# Patient Record
Sex: Female | Born: 1970 | Race: White | Hispanic: No | Marital: Single | Smoking: Never smoker
Health system: Southern US, Community
[De-identification: ages and names within clinical notes are randomized; demographics above are authoritative.]

## PROBLEM LIST (undated history)

## (undated) DIAGNOSIS — G7102 Facioscapulohumeral muscular dystrophy: Secondary | ICD-10-CM

## (undated) HISTORY — PX: ABDOMINOPLASTY: SUR9

## (undated) HISTORY — PX: ANKLE SURGERY: SHX546

## (undated) HISTORY — PX: LAPAROSCOPIC GASTRIC SLEEVE RESECTION: SHX5895

---

## 2019-01-25 ENCOUNTER — Encounter: Payer: Self-pay | Admitting: Emergency Medicine

## 2019-01-25 ENCOUNTER — Emergency Department
Admission: EM | Admit: 2019-01-25 | Discharge: 2019-01-25 | Disposition: A | Discharge status: Home / Self Care | Payer: Medicare Other | Attending: Emergency Medicine | Admitting: Emergency Medicine

## 2019-01-25 ENCOUNTER — Emergency Department: Payer: Medicare Other

## 2019-01-25 ENCOUNTER — Other Ambulatory Visit: Payer: Self-pay

## 2019-01-25 DIAGNOSIS — Y92009 Unspecified place in unspecified non-institutional (private) residence as the place of occurrence of the external cause: Secondary | ICD-10-CM | POA: Insufficient documentation

## 2019-01-25 DIAGNOSIS — G44311 Acute post-traumatic headache, intractable: Secondary | ICD-10-CM | POA: Insufficient documentation

## 2019-01-25 DIAGNOSIS — Y998 Other external cause status: Secondary | ICD-10-CM | POA: Insufficient documentation

## 2019-01-25 DIAGNOSIS — S161XXA Strain of muscle, fascia and tendon at neck level, initial encounter: Secondary | ICD-10-CM

## 2019-01-25 DIAGNOSIS — S0181XA Laceration without foreign body of other part of head, initial encounter: Secondary | ICD-10-CM | POA: Insufficient documentation

## 2019-01-25 DIAGNOSIS — Y9389 Activity, other specified: Secondary | ICD-10-CM | POA: Diagnosis not present

## 2019-01-25 DIAGNOSIS — G7100 Muscular dystrophy, unspecified: Secondary | ICD-10-CM | POA: Insufficient documentation

## 2019-01-25 DIAGNOSIS — S0990XA Unspecified injury of head, initial encounter: Secondary | ICD-10-CM | POA: Diagnosis not present

## 2019-01-25 DIAGNOSIS — W07XXXA Fall from chair, initial encounter: Secondary | ICD-10-CM | POA: Insufficient documentation

## 2019-01-25 HISTORY — DX: Facioscapulohumeral muscular dystrophy: G71.02

## 2019-01-25 MED ORDER — HYDROCODONE-ACETAMINOPHEN 5-325 MG PO TABS
1.0000 | ORAL_TABLET | Freq: Once | ORAL | Status: AC
  Administered 2019-01-25: 1 via ORAL
  Filled 2019-01-25: qty 1

## 2019-01-25 MED ORDER — HYDROCODONE-ACETAMINOPHEN 5-325 MG PO TABS: 1.0000 | ORAL_TABLET | Freq: Four times a day (QID) | ORAL | 0 refills | Status: AC | PRN

## 2019-01-25 MED ORDER — BUTALBITAL-APAP-CAFFEINE 50-325-40 MG PO TABS
2.0000 | ORAL_TABLET | Freq: Once | ORAL | Status: AC
  Administered 2019-01-25: 2 via ORAL
  Filled 2019-01-25: qty 2

## 2019-01-25 MED ORDER — LIDOCAINE-EPINEPHRINE-TETRACAINE (LET) SOLUTION
3.0000 mL | Freq: Once | NASAL | Status: AC
  Administered 2019-01-25: 3 mL via TOPICAL
  Filled 2019-01-25: qty 3

## 2019-01-25 MED ORDER — LIDOCAINE HCL (PF) 1 % IJ SOLN
5.0000 mL | Freq: Once | INTRAMUSCULAR | Status: AC
  Administered 2019-01-25: 15:00:00 5 mL via INTRADERMAL
  Filled 2019-01-25: qty 5

## 2019-01-25 NOTE — ED Notes (Signed)
AAOx3.  Skin warm and dry.  NAD 

## 2019-01-25 NOTE — Discharge Instructions (Signed)
Go to urgent care for suture removal in 5 days.  Return to the ER for any symptoms of concern.

## 2019-01-25 NOTE — ED Triage Notes (Signed)
Arrives via [facility].  Patient was getting on stair lift, patient slipped on a plastic bag and fell forward onto concrete floor, laceration to chin.  No LOC

## 2019-01-25 NOTE — ED Provider Notes (Signed)
[facility] Emergency Department Provider Note ____________________________________________  Time seen: Approximately 2:02 PM  I have reviewed the triage vital signs and the nursing notes.   HISTORY  Chief Complaint Fall    HPI Tracy Waters is a 48 y.o. female who presents to the emergency department for evaluation and treatment after mechanical, non-syncopal fall.  She was attempting to get on her chair lift when her foot slipped on a bag that was lying in the floor and her chin struck concrete.  Patient has a history of muscular dystrophy and states that her arms are too weak to catch herself.  She states that she falls often. Past Medical History:  Diagnosis Date  . 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   Past Surgical History:  Procedure Laterality Date  . ABDOMINOPLASTY    . ANKLE SURGERY    . CESAREAN SECTION    . LAPAROSCOPIC GASTRIC SLEEVE RESECTION      Prior to Admission medications   Medication Sig Start Date End Date Taking? Authorizing Provider  HYDROcodone-acetaminophen (NORCO/VICODIN) 5-325 MG tablet Take 1 tablet by mouth every 6 (six) hours as needed for up to 3 days for severe pain. 01/25/19 01/28/19  Sherrie George B, FNP    Allergies Penicillins  No family history on file.  Social History Social History   Tobacco Use  . Smoking status: Never Smoker  . Smokeless tobacco: Never Used  Substance Use Topics  . Alcohol use: Yes    Comment: social  . Drug use: Never    Review of Systems Constitutional: Negative for fever. Cardiovascular: Negative for chest pain. Respiratory: Negative for shortness of breath. Musculoskeletal: Positive for neck and upper back pain Skin: Positive for laceration to the chin Neurological: Negative for decrease in sensation  ____________________________________________   PHYSICAL EXAM:  VITAL SIGNS: ED Triage Vitals   Enc Vitals Group     BP 01/25/19 1341 122/85     Pulse Rate 01/25/19 1341 79     Resp 01/25/19 1341 16     Temp 01/25/19 1341 97.9 F (36.6 C)     Temp Source 01/25/19 1341 Oral     SpO2 01/25/19 1341 97 %     Weight 01/25/19 1337 155 lb (70.3 kg)     Height 01/25/19 1337 5\' 7"  (1.702 m)     Head Circumference --      Peak Flow --      Pain Score 01/25/19 1337 9     Pain Loc --      Pain Edu? --      Excl. in [place]? --     Constitutional: Alert and oriented. Well appearing and in no acute distress. Eyes: Conjunctivae are clear without discharge or drainage Head: Atraumatic Neck: Midline tenderness to the lower C-spine and upper thoracic spine. Respiratory: No cough. Respirations are even and unlabored. Musculoskeletal: Awake, alert, oriented. Neurologic: At baseline  Skin: 2.5cm laceration under the apex of the chin.  Psychiatric: Affect and behavior are appropriate.  ____________________________________________   LABS (all labs ordered are listed, but only abnormal results are displayed)  Labs Reviewed - No data to display ____________________________________________  RADIOLOGY  CT of the head, cervical spine, and thoracic spine are all negative for acute findings per radiology. ____________________________________________   PROCEDURES  .Marland KitchenLaceration Repair Date/Time: 01/25/2019 9:08 PM Performed by: Victorino Dike, FNP Authorized by: Victorino Dike, FNP   Consent:  Consent obtained:  Verbal   Consent given by:  Patient   Risks discussed:  Infection, pain, retained foreign body and poor cosmetic result Anesthesia (see MAR for exact dosages):    Anesthesia method:  Local infiltration and topical application   Topical anesthetic:  LET   Local anesthetic:  Lidocaine 1% w/o epi Laceration details:    Location:  Face   Face location:  Chin   Length (cm):  2.5 Repair type:    Repair type:  Complex Pre-procedure details:    Preparation:  Patient was prepped  and draped in usual sterile fashion Exploration:    Hemostasis achieved with:  Direct pressure   Wound exploration: entire depth of wound probed and visualized     Contaminated: no   Treatment:    Area cleansed with:  Saline   Amount of cleaning:  Extensive   Irrigation solution:  Sterile saline   Visualized foreign bodies/material removed: no   Subcutaneous repair:    Suture size:  6-0   Suture material:  Vicryl   Number of sutures:  1 Skin repair:    Repair method:  Sutures   Suture size:  6-0   Suture material:  Prolene   Number of sutures:  5 Approximation:    Approximation:  Close Post-procedure details:    Dressing:  Sterile dressing   Patient tolerance of procedure:  Tolerated well, no immediate complications    ____________________________________________   INITIAL IMPRESSION / ASSESSMENT AND PLAN / ED COURSE  Sherilyn CooterCatherine Coble is a 48 y.o. who presents to the emergency department for evaluation after mechanical, non-syncopal fall at home.  She is complaining of headache with some midline cervical and upper thoracic spine tenderness.  CTs will be completed.  She also has a laceration under the apex of her chin that will require sutures.  CTs of the head, cervical spine, and thoracic spine are all reassuring.  Sutures were inserted to the wound under her chin as above.  Patient will be discharged home with prescriptions as listed below.  [facility] sutures removed.  She was encouraged to return to the emergency department for symptoms of concern if unable to schedule an appointment with primary care.  Medications  butalbital-acetaminophen-caffeine (FIORICET) 50-325-40 MG per tablet 2 tablet (2 tablets Oral Given 01/25/19 1417)  lidocaine-EPINEPHrine-tetracaine (LET) solution (3 mLs Topical Given 01/25/19 1515)  lidocaine (PF) (XYLOCAINE) 1 % injection 5 mL (5 mLs Intradermal Given 01/25/19 1516)   HYDROcodone-acetaminophen (NORCO/VICODIN) 5-325 MG per tablet 1 tablet (1 tablet Oral Given 01/25/19 1613)    Pertinent labs & imaging results that were available during my care of the patient were reviewed by me and considered in my medical decision making (see chart for details).  _________________________________________   FINAL CLINICAL IMPRESSION(S) / ED DIAGNOSES  Final diagnoses:  Minor head injury, initial encounter  Cervical strain, acute, initial encounter  Intractable acute post-traumatic headache    ED Discharge Orders         Ordered    HYDROcodone-acetaminophen (NORCO/VICODIN) 5-325 MG tablet  Every 6 hours PRN     01/25/19 1606           If controlled substance prescribed during this visit, 12 month history viewed on the [facility] prior to issuing an initial prescription for Schedule II or III opiod.   Chinita Pesterriplett, Angelica Frandsen B, FNP 01/25/19 2110    Jeanmarie PlantMcShane, James A, MD 01/29/19 1801

## 2020-11-17 IMAGING — CT CT HEAD WITHOUT CONTRAST
5 of 7 series · 17 of 47 positions shown, 18 images · non-contrast
Comparison: None.

CLINICAL DATA: Fall. Pain. No reported loss of consciousness.

EXAM:
CT HEAD WITHOUT CONTRAST
CT CERVICAL SPINE WITHOUT CONTRAST
TECHNIQUE: Multidetector CT imaging of the head and cervical spine was
performed following the standard protocol without intravenous
contrast. Multiplanar CT image reconstructions of the cervical spine
were also generated.

[Series 4: head wo · axial · 0.40mm/px · z∈[+1401,+1446]mm · 2 of 29 slices shown, 3 images]
[im 10/29  brain]
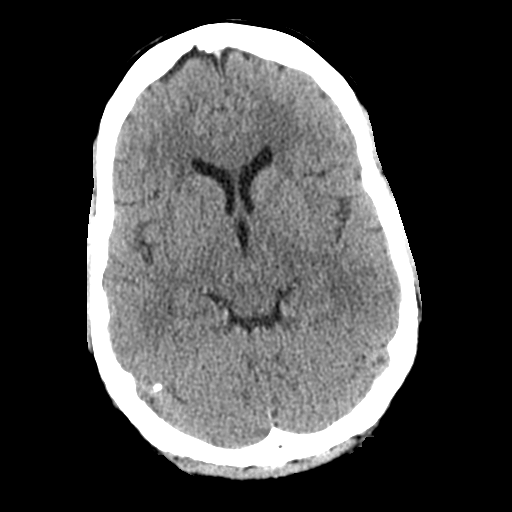
[im 10/29  bone]
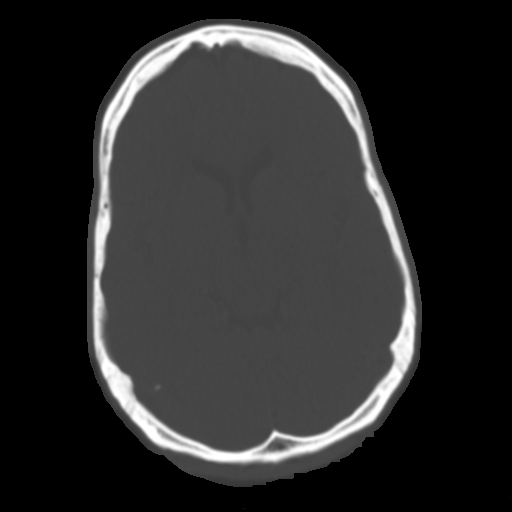
[im 19/29  brain]
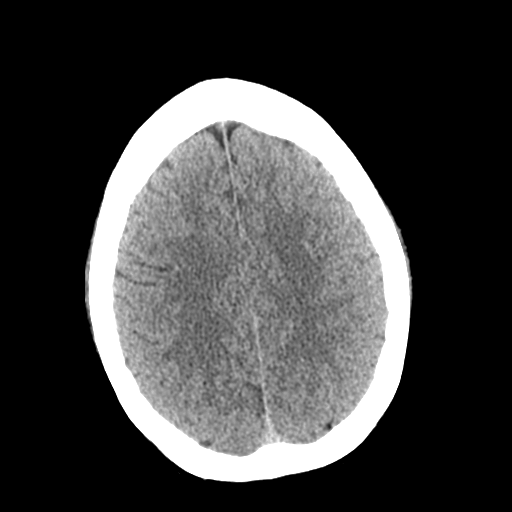

[Series 5: coronal soft tissue · coronal · 0.28mm/px · 3 of 65 slices shown]
[im 19/65  brain]
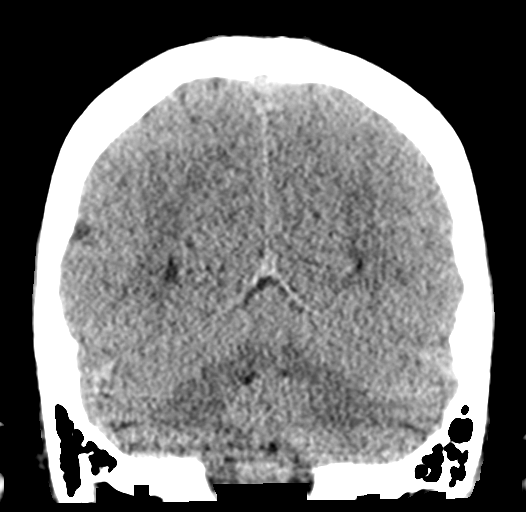
[im 28/65  brain]
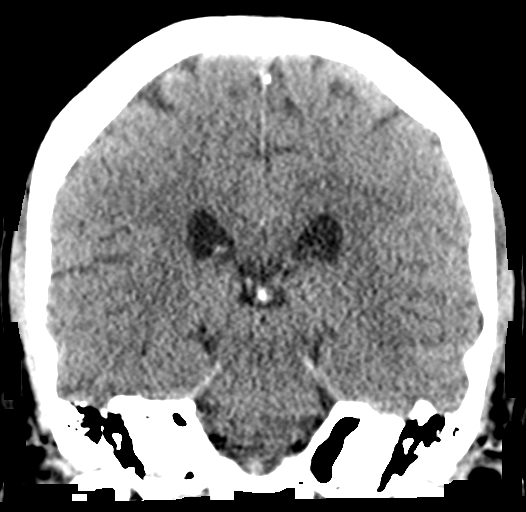
[im 37/65  brain]
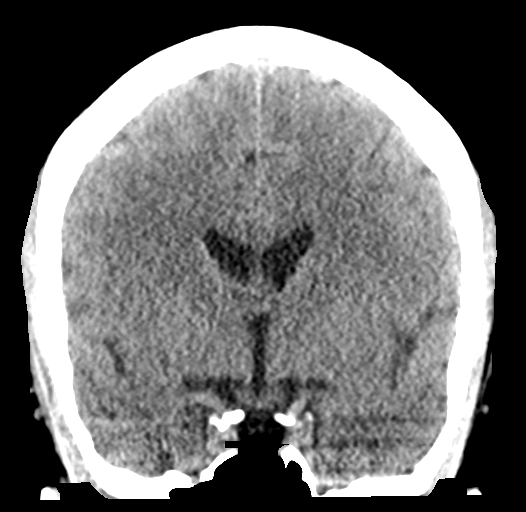

[Series 6: sagittal soft tissue · sagittal · 0.28mm/px · 1 of 50 slices shown]
[im 25/50  brain]
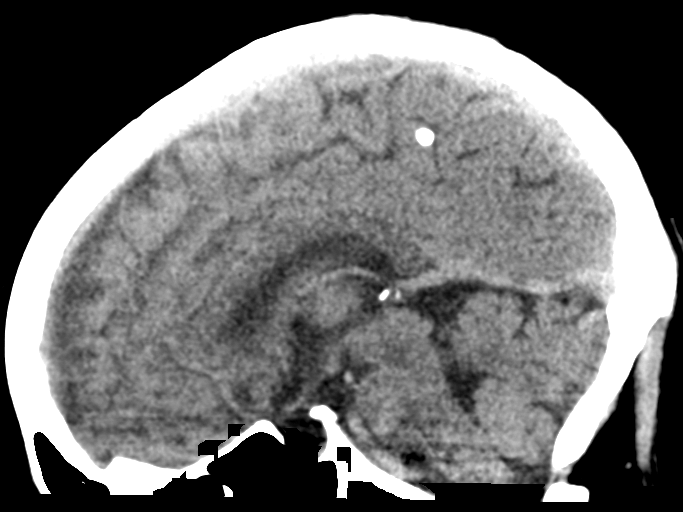

[Series 7: c spine soft · axial · 0.30mm/px · z∈[+1202,+1234]mm · 3 of 84 slices shown]
[im 9/84  brain]
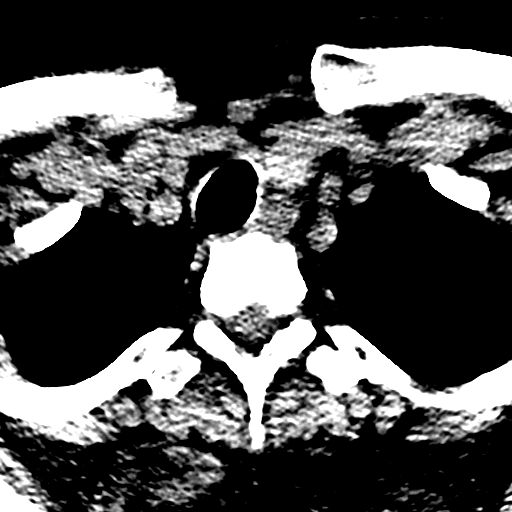
[im 17/84  brain]
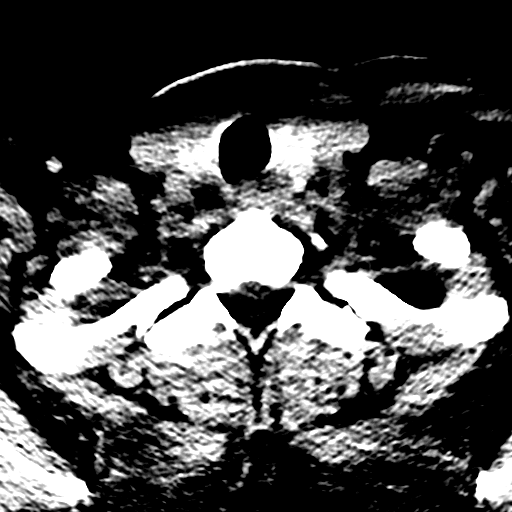
[im 25/84  brain]
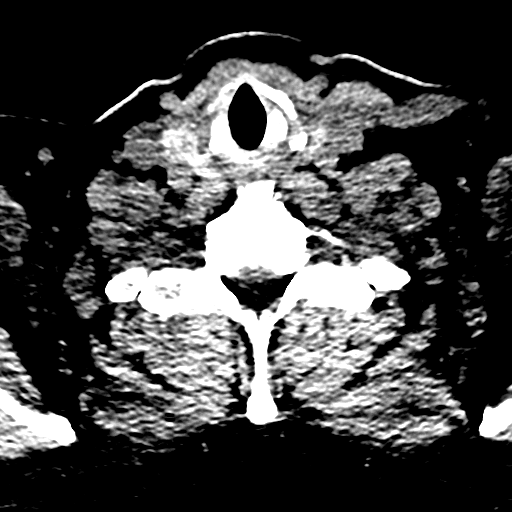

[Series 10: orthogonal bone · axial · 0.21mm/px · z∈[+1159,+1331]mm · 8 of 113 slices shown]
[im 9/113  bone]
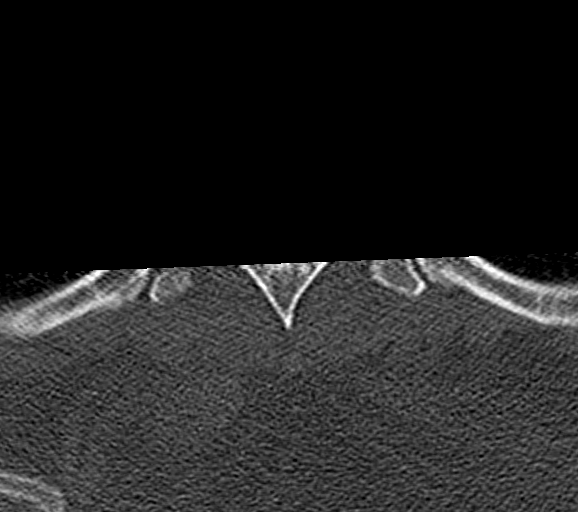
[im 25/113  bone]
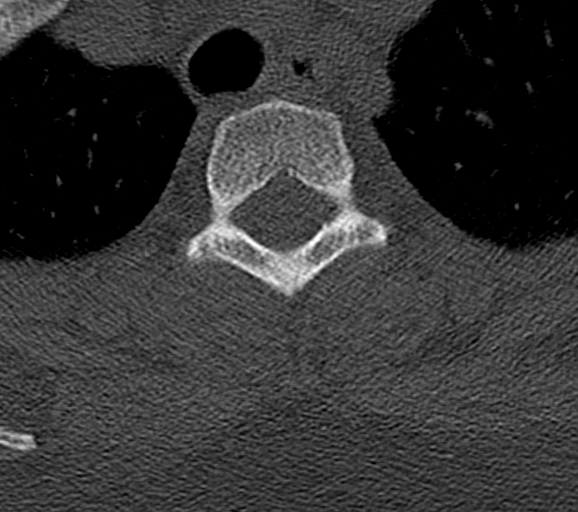
[im 41/113  bone]
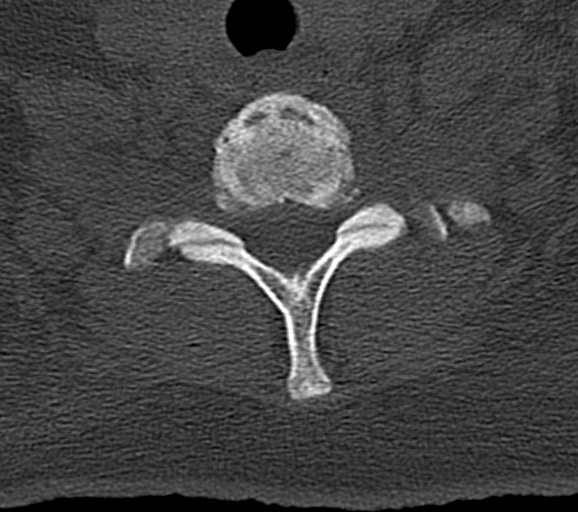
[im 49/113  bone]
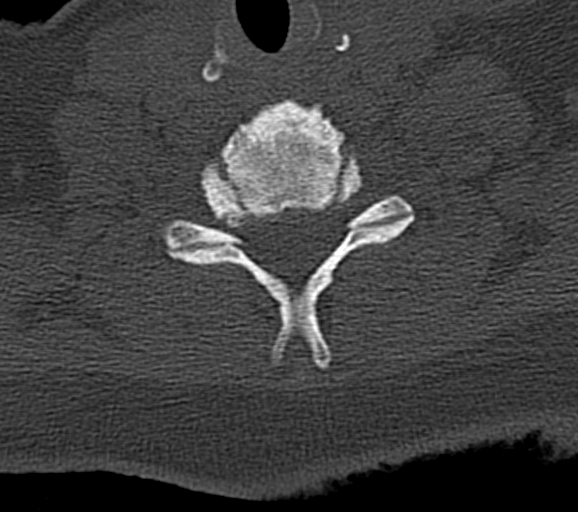
[im 65/113  bone]
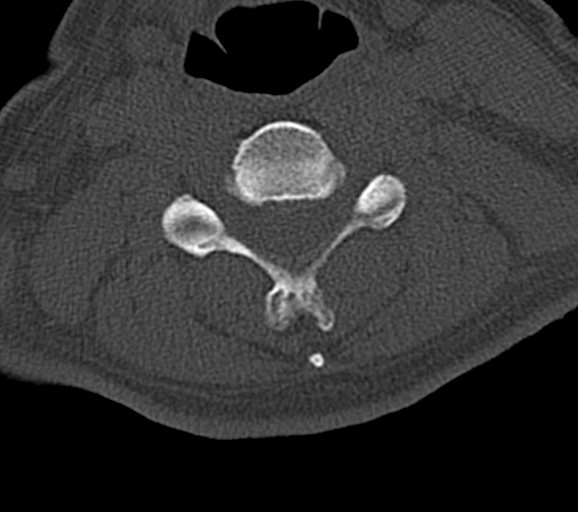
[im 73/113  bone]
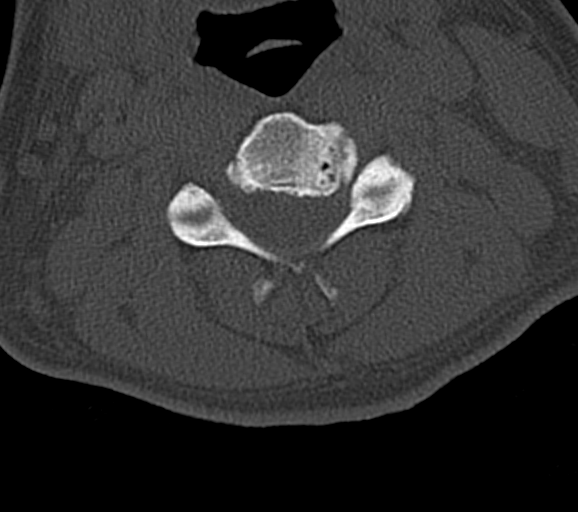
[im 89/113  bone]
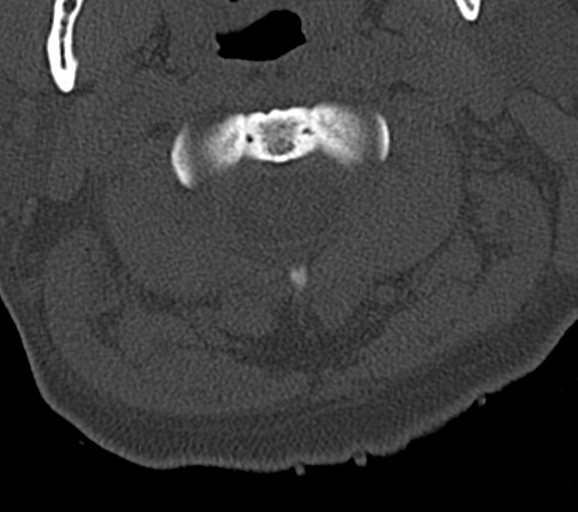
[im 105/113  bone]
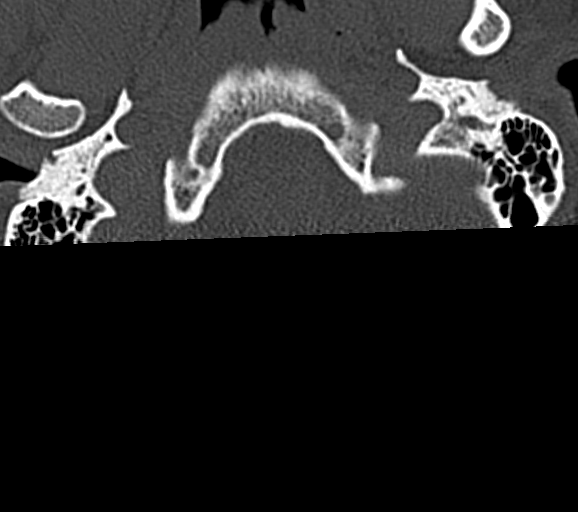

[17 of 47 positions shown; findings below may reference images not displayed]

FINDINGS: CT HEAD FINDINGS

Brain: No evidence of acute infarction, hemorrhage, hydrocephalus,
extra-axial collection or mass lesion/mass effect.

Vascular: No hyperdense vessel or unexpected calcification.

Skull: Normal. Negative for fracture or focal lesion.

Sinuses/Orbits: No acute finding.

Other: Superficial forehead dermal calcifications, appear nonacute.

CT CERVICAL SPINE FINDINGS

Alignment: Straightening, slight reversal of the normal cervical
lordosis. No traumatic subluxation.

Skull base and vertebrae: No acute fracture. No primary bone lesion
or focal pathologic process.

Soft tissues and spinal canal: No prevertebral fluid or swelling. No
visible canal hematoma.

Disc levels:

C2-3:  Unremarkable.

C3-4: Uncinate spurring and facet arthropathy to the LEFT resulting
in C4 foraminal narrowing.

C4-5:  Unremarkable.

C5-6: Disc space narrowing with anterior proliferative spurring.
RIGHT-sided uncinate spurring narrows the C6 foramen.

C6-7: Disc space narrowing. Anterior peripherally spurring. Uncinate
hypertrophy narrows the RIGHT C7 foramen.

C7-T1: Facet arthropathy. Disc space narrowing. No definite
impingement.

Upper chest: Negative.

Other: None.
IMPRESSION: 1. No skull fracture or intracranial hemorrhage.
2. No cervical spine fracture or traumatic subluxation. Multilevel
spondylosis.
